# Patient Record
Sex: Male | Born: 1975 | Race: White | Hispanic: No | Marital: Married | Smoking: Never smoker
Health system: Southern US, Community
[De-identification: ages and names within clinical notes are randomized; demographics above are authoritative.]

## PROBLEM LIST (undated history)

## (undated) HISTORY — PX: VASECTOMY: SHX75

---

## 2006-03-28 ENCOUNTER — Ambulatory Visit: Payer: Self-pay | Admitting: Internal Medicine

## 2014-08-05 ENCOUNTER — Ambulatory Visit (INDEPENDENT_AMBULATORY_CARE_PROVIDER_SITE_OTHER): Payer: 59 | Admitting: Family Medicine

## 2014-08-05 ENCOUNTER — Ambulatory Visit (INDEPENDENT_AMBULATORY_CARE_PROVIDER_SITE_OTHER): Payer: 59

## 2014-08-05 VITALS — BP 118/82 | HR 82 | Temp 98.5°F | Resp 16 | Ht 77.0 in | Wt 214.6 lb

## 2014-08-05 DIAGNOSIS — R55 Syncope and collapse: Secondary | ICD-10-CM

## 2014-08-05 DIAGNOSIS — S8391XA Sprain of unspecified site of right knee, initial encounter: Secondary | ICD-10-CM

## 2014-08-05 LAB — COMPREHENSIVE METABOLIC PANEL
ALT: 32 U/L (ref 0–53)
AST: 22 U/L (ref 0–37)
Albumin: 4.5 g/dL (ref 3.5–5.2)
Alkaline Phosphatase: 70 U/L (ref 39–117)
BUN: 13 mg/dL (ref 6–23)
CALCIUM: 9.5 mg/dL (ref 8.4–10.5)
CO2: 26 mEq/L (ref 19–32)
Chloride: 102 mEq/L (ref 96–112)
Creat: 1.08 mg/dL (ref 0.50–1.35)
GLUCOSE: 105 mg/dL — AB (ref 70–99)
POTASSIUM: 4.8 meq/L (ref 3.5–5.3)
Sodium: 138 mEq/L (ref 135–145)
Total Bilirubin: 1.2 mg/dL (ref 0.2–1.2)
Total Protein: 7.3 g/dL (ref 6.0–8.3)

## 2014-08-05 LAB — POCT CBC
GRANULOCYTE PERCENT: 78.2 % (ref 37–80)
HCT, POC: 49.3 % (ref 43.5–53.7)
HEMOGLOBIN: 15.8 g/dL (ref 14.1–18.1)
LYMPH, POC: 1.8 (ref 0.6–3.4)
MCH, POC: 29.1 pg (ref 27–31.2)
MCHC: 32.1 g/dL (ref 31.8–35.4)
MCV: 90.6 fL (ref 80–97)
MID (cbc): 0.3 (ref 0–0.9)
MPV: 7.6 fL (ref 0–99.8)
PLATELET COUNT, POC: 208 10*3/uL (ref 142–424)
POC Granulocyte: 7.5 — AB (ref 2–6.9)
POC LYMPH PERCENT: 18.8 %L (ref 10–50)
POC MID %: 3 % (ref 0–12)
RBC: 5.44 M/uL (ref 4.69–6.13)
RDW, POC: 13.6 %
WBC: 9.6 10*3/uL (ref 4.6–10.2)

## 2014-08-05 LAB — GLUCOSE, POCT (MANUAL RESULT ENTRY): POC GLUCOSE: 108 mg/dL — AB (ref 70–99)

## 2014-08-05 MED ORDER — HYDROCODONE-ACETAMINOPHEN 5-325 MG PO TABS: 1.0000 | ORAL_TABLET | Freq: Three times a day (TID) | ORAL | Status: DC | PRN

## 2014-08-05 NOTE — Patient Instructions (Signed)
Continue to ice and elevate your knee as much as you can. Use ibuprofen as needed for swelling and the vicodin as needed for pain Remember the vicodin will make you sleepy Use the crutches as needed for support, and the knee brace when you are up and about.   We will set you up to see orthopedics next week.  Let me know if you do not hear about this appt soon I think you likely passed out due to a pain related "vagal response."  This will probably not happen again but if you do start to feel faint or nauseated/ sweaty sit down!   If you have any further episodes of passing out please seek care again. I will be in touch with the rest of your labs

## 2014-08-05 NOTE — Progress Notes (Signed)
[facility] [address], [place] [address] [phone]  Date:  08/05/2014   Name:  David Donaldson   DOB:  Apr 27, 1976   MRN:  [id number]  PCP:  No primary care provider on file.    Chief Complaint: Knee Pain   History of Present Illness:  David Donaldson is a 39 y.o. very pleasant male patient who presents with the following:  While playing basketball yesterday he hurt his right knee. His right foot was planted- his son accidnetally hit his lateral knee and forced into into valgus.  He felt and heard a pop, "turned white" from the pain. It is swollen and painful.  He has used some crutches that he had at home. He is able to walk and put some weight on the knee, but he cannot bend it really.    Last night he fell ok,  went to a concert.  After standing for a while he suddently felt very nauseated- he then passed out and was attended to by EMS.  He has not passed out in the past, did not hit his head.  He thinks that the pain caused him to pass out. He does not note any current dizziness, CP or palpitations.    He has some crutches and a hinged knee brace that he is using from home.  These seem to be helping him  He is generally in good health, accompanied today by his wife  There are no active problems to display for this patient.   History reviewed. No pertinent past medical history.  Past Surgical History  Procedure Laterality Date  . Vasectomy      History  Substance Use Topics  . Smoking status: Never Smoker   . Smokeless tobacco: Not on file  . Alcohol Use: 1.8 oz/week    0 Not specified, 3 Glasses of wine per week    Family History  Problem Relation Age of Onset  . Diabetes Father   . Hyperlipidemia Father   . Cancer Sister     No Known Allergies  Medication list has been reviewed and updated.  No current outpatient prescriptions on file prior to visit.   No current facility-administered medications on file prior to visit.     Review of Systems:  As per HPI- otherwise negative.   Physical Examination: Filed Vitals:   08/05/14 1122  BP: 104/64  Pulse: 82  Temp: 98.5 F (36.9 C)  Resp: 16   Filed Vitals:   08/05/14 1122  Height:  (1.956 m)  Weight: 214 lb 9.6 oz (97.342 kg)   Body mass index is 25.44 kg/(m^2). Ideal Body Weight: Weight in (lb) to have BMI = 25: 210.4  GEN: WDWN, NAD, Non-toxic, A & O x 3, fit appearing and healthy HEENT: Atraumatic, Normocephalic. Neck supple. No masses, No LAD. Ears and Nose: No external deformity. CV: RRR, No M/G/R. No JVD. No thrill. No extra heart sounds. PULM: CTA B, no wheezes, crackles, rhonchi. No retractions. No resp. distress. No accessory muscle use. EXTR: No c/c/e NEURO able to bear weight but walking slowly, does not like to fully extend knee. He has his own crutches which are of the appropriate height PSYCH: Normally interactive. Conversant. Not depressed or anxious appearing.  Calm demeanor.  Right knee:  Mild swelling/ effusion.  Tenderness at the inferior/ medial patella.  Ligaments are stable to my exam- no bruise or heat, no instability   [facility] reading (PRIMARY) by  Dr.  Luciano Cinquemani. Right knee: negative except for small effusion CLINICAL DATA: Right knee sprain. Initial encounter. Fall.  EXAM: RIGHT KNEE - COMPLETE 4+ VIEW  COMPARISON: None.  FINDINGS: Moderate suprapatellar joint effusion. No acute fracture or dislocation. Joint spaces maintained.  IMPRESSION: Suprapatellar joint effusion.  EKG:  Rate 62, NSR  Results for orders placed or performed in visit on 08/05/14  POCT CBC  Result Value Ref Range   WBC 9.6 4.6 - 10.2 K/uL   Lymph, poc 1.8 0.6 - 3.4   POC LYMPH PERCENT 18.8 10 - 50 %L   MID (cbc) 0.3 0 - 0.9   POC MID % 3.0 0 - 12 %M   POC Granulocyte 7.5 (A) 2 - 6.9   Granulocyte percent 78.2 37 - 80 %G   RBC 5.44 4.69 - 6.13 M/uL   Hemoglobin 15.8 14.1 - 18.1 g/dL   HCT, POC 29.5 62.1 - 53.7 %   MCV 90.6 80 -  97 fL   MCH, POC 29.1 27 - 31.2 pg   MCHC 32.1 31.8 - 35.4 g/dL   RDW, POC 30.8 %   Platelet Count, POC 208 142 - 424 K/uL   MPV 7.6 0 - 99.8 fL  POCT glucose (manual entry)  Result Value Ref Range   POC Glucose 108 (A) 70 - 99 mg/dl    Assessment and Plan: Right knee sprain, initial encounter - Plan: DG Knee Complete 4 Views Right, Ambulatory referral to Orthopedic Surgery, HYDROcodone-acetaminophen (NORCO/VICODIN) 5-325 MG per tablet  Vasovagal syncope - Plan: POCT CBC, POCT glucose (manual entry), EKG 12-Lead, Comprehensive metabolic panel  Knee sprain. He felt most comfortable in the hinged knee brace that he had with him.  He will continue this and his crutches.  He may have an internal derangement of some kind.  Vicodin as needed for pain and referral to ortho asap  Syncope: likely vagal response to pain.  Will treat pain with vicodin, await the rest of his labs.  He will report if any further syncope  Signed Abbe Amsterdam, MD

## 2014-08-06 ENCOUNTER — Encounter: Payer: Self-pay | Admitting: Family Medicine

## 2014-08-31 ENCOUNTER — Ambulatory Visit: Payer: 59

## 2014-10-27 ENCOUNTER — Ambulatory Visit (INDEPENDENT_AMBULATORY_CARE_PROVIDER_SITE_OTHER): Payer: 59 | Admitting: Psychology

## 2014-10-27 DIAGNOSIS — F4323 Adjustment disorder with mixed anxiety and depressed mood: Secondary | ICD-10-CM | POA: Diagnosis not present

## 2014-11-15 ENCOUNTER — Ambulatory Visit (INDEPENDENT_AMBULATORY_CARE_PROVIDER_SITE_OTHER): Payer: 59 | Admitting: Psychology

## 2014-11-15 DIAGNOSIS — F4323 Adjustment disorder with mixed anxiety and depressed mood: Secondary | ICD-10-CM

## 2014-12-01 ENCOUNTER — Ambulatory Visit (INDEPENDENT_AMBULATORY_CARE_PROVIDER_SITE_OTHER): Payer: 59 | Admitting: Psychology

## 2014-12-01 DIAGNOSIS — F4323 Adjustment disorder with mixed anxiety and depressed mood: Secondary | ICD-10-CM

## 2014-12-22 ENCOUNTER — Ambulatory Visit: Payer: 59 | Admitting: Psychology

## 2015-01-12 ENCOUNTER — Ambulatory Visit (INDEPENDENT_AMBULATORY_CARE_PROVIDER_SITE_OTHER): Payer: 59 | Admitting: Psychology

## 2015-01-12 DIAGNOSIS — F4323 Adjustment disorder with mixed anxiety and depressed mood: Secondary | ICD-10-CM | POA: Diagnosis not present

## 2015-01-26 ENCOUNTER — Ambulatory Visit: Payer: 59 | Admitting: Psychology

## 2015-02-21 ENCOUNTER — Ambulatory Visit (INDEPENDENT_AMBULATORY_CARE_PROVIDER_SITE_OTHER): Payer: 59 | Admitting: Psychology

## 2015-02-21 DIAGNOSIS — F4323 Adjustment disorder with mixed anxiety and depressed mood: Secondary | ICD-10-CM | POA: Diagnosis not present

## 2015-03-07 ENCOUNTER — Ambulatory Visit: Payer: 59 | Admitting: Psychology

## 2015-05-06 IMAGING — CR DG KNEE COMPLETE 4+V*R*
4 series · 4 of 4 positions shown · non-contrast
Comparison: None.

CLINICAL DATA: Right knee sprain.  Initial encounter.  Fall.

EXAM:
RIGHT KNEE - COMPLETE 4+ VIEW

[AP]
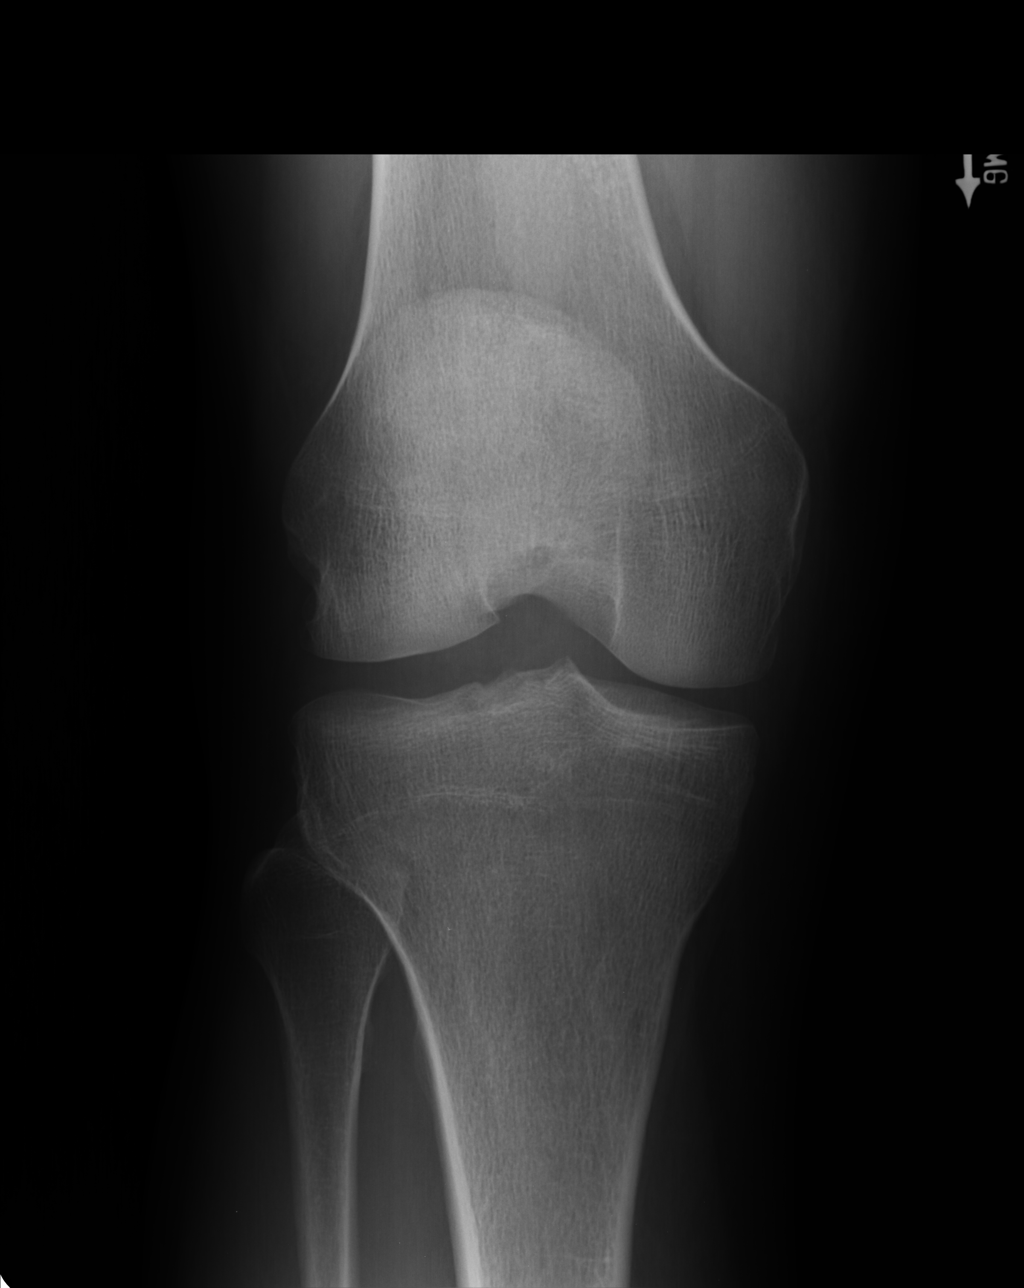

[ap axial]
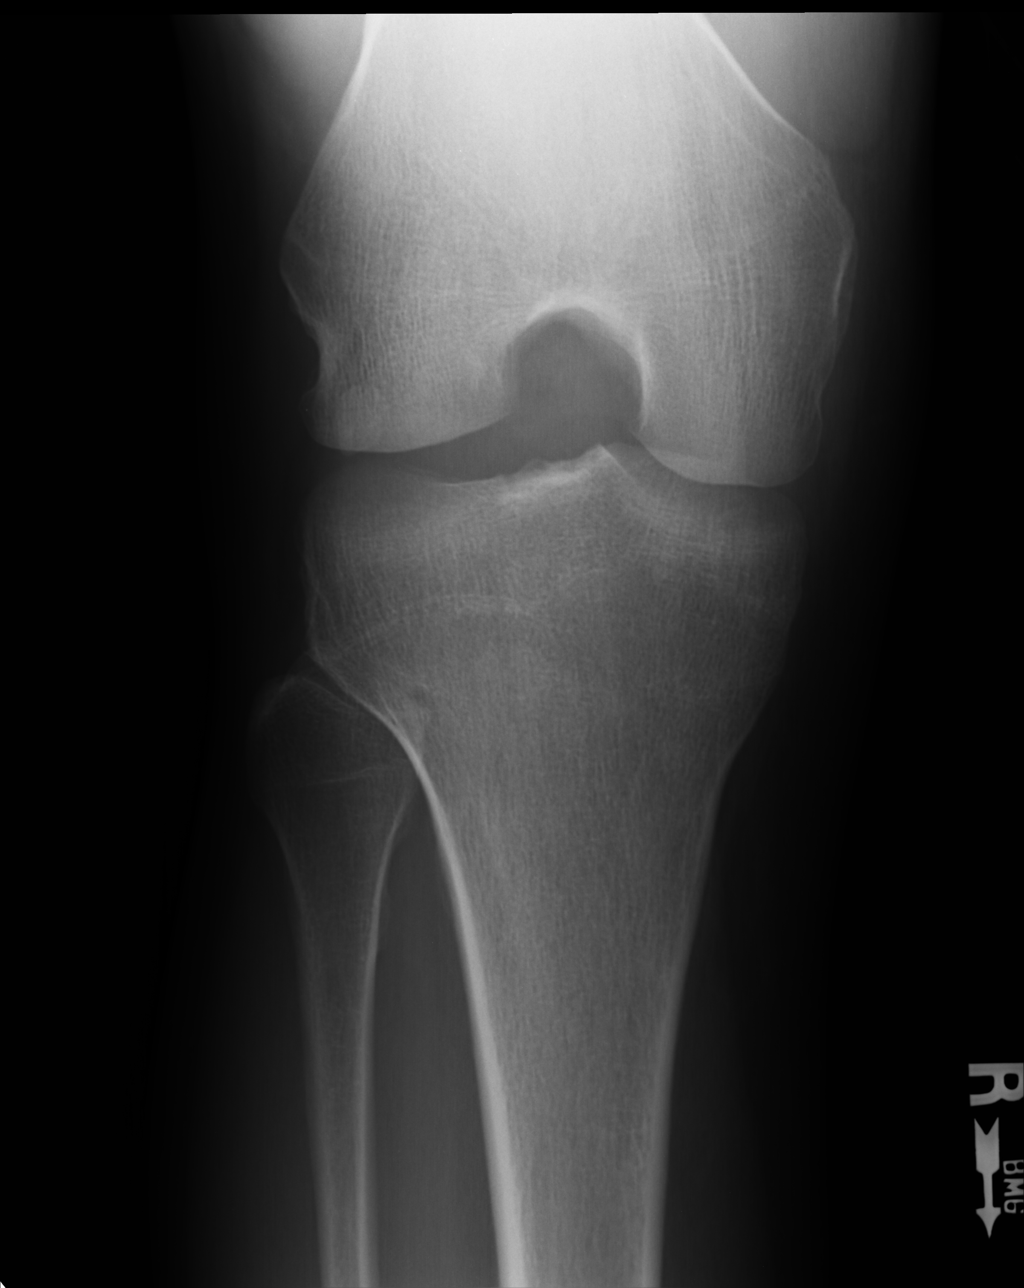

[sunrise]
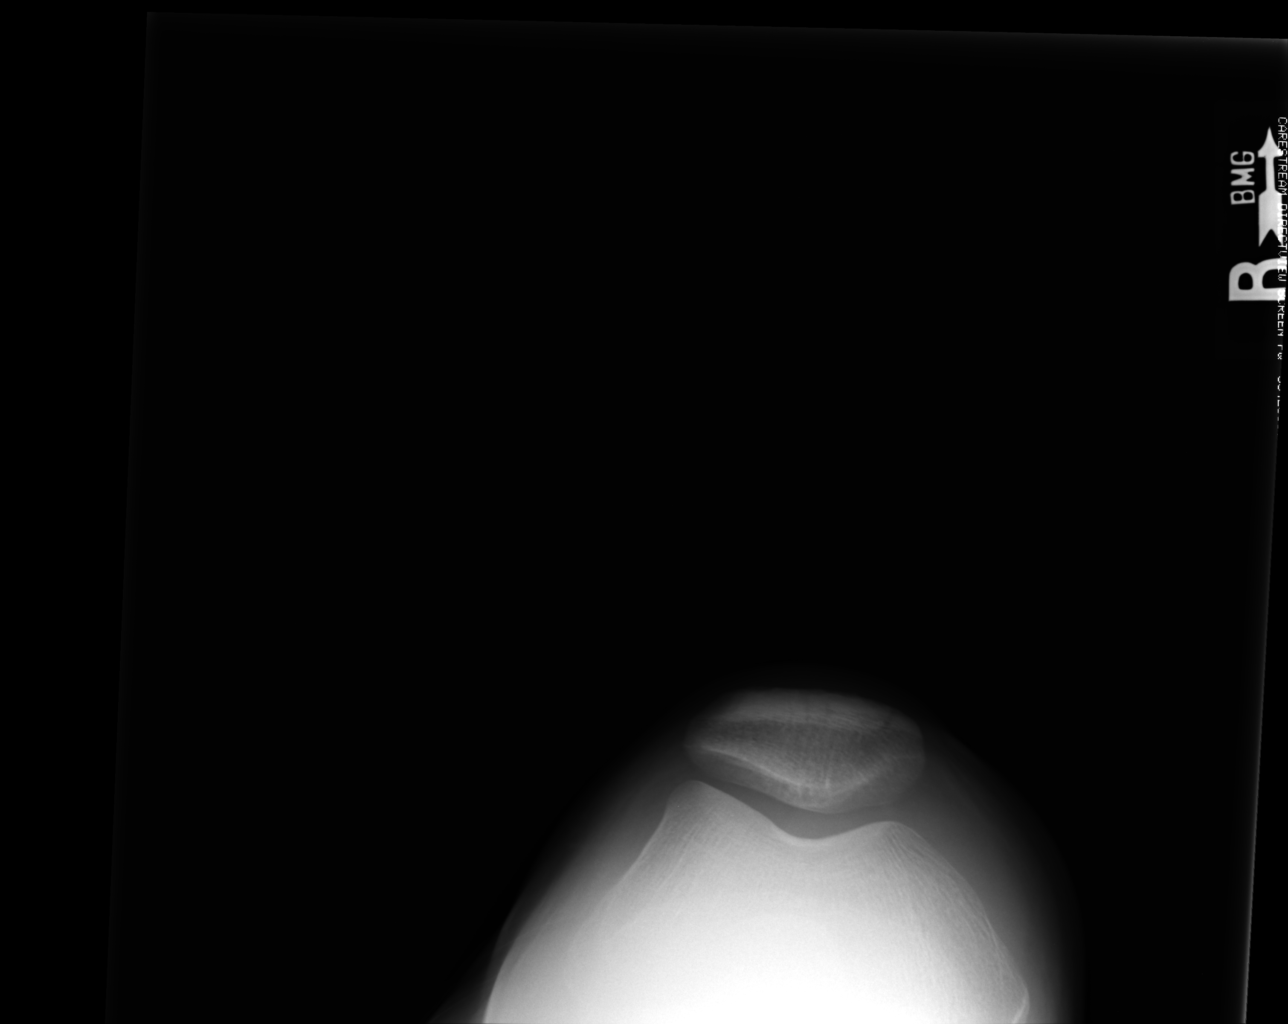

[lateral]
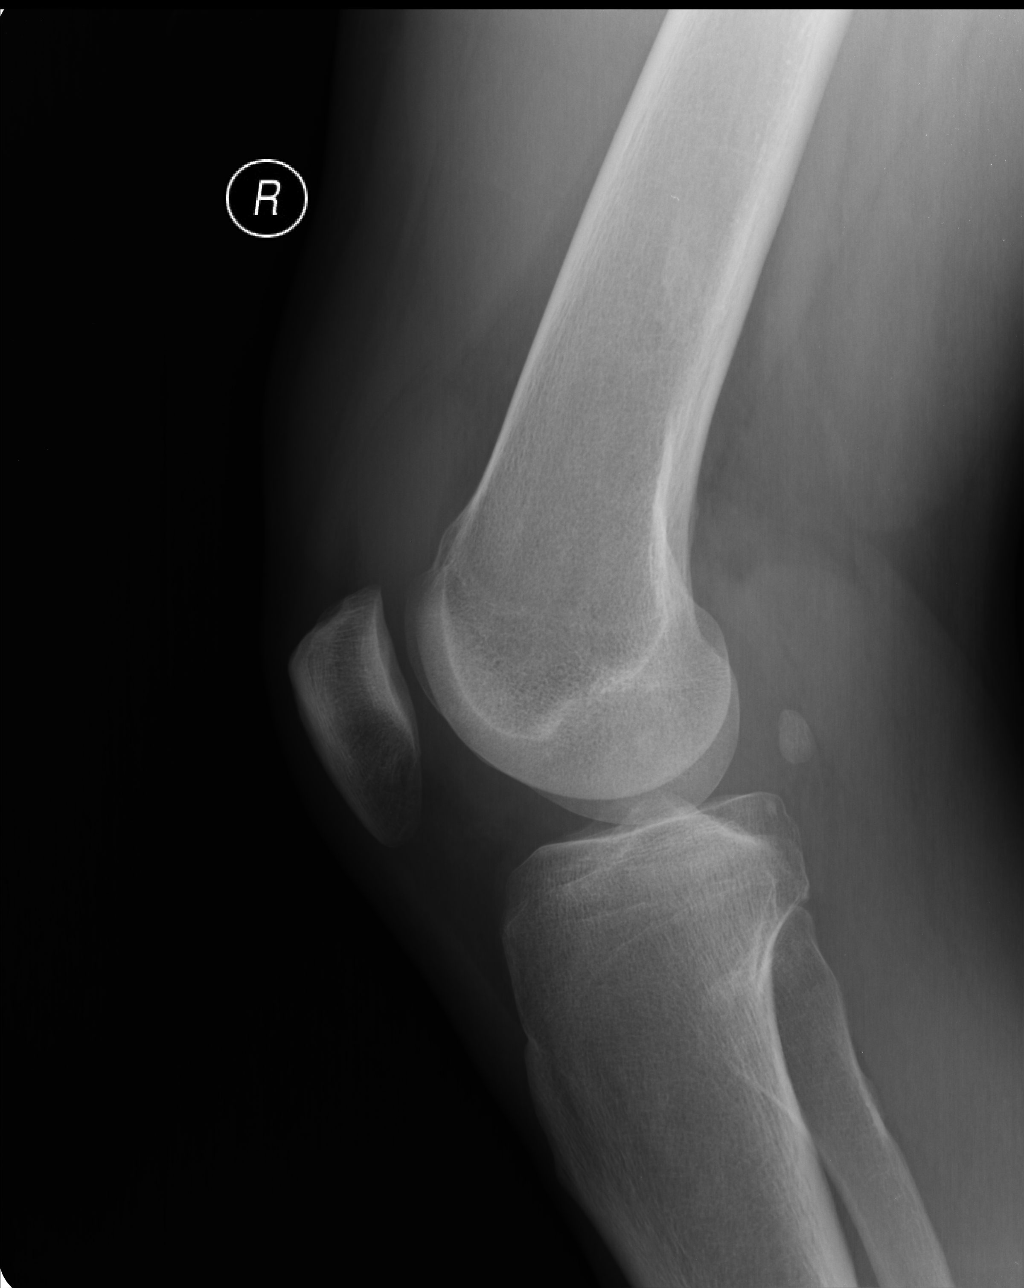

[4 of 4 positions shown; findings below may reference images not displayed]

FINDINGS: Moderate suprapatellar joint effusion. No acute fracture or
dislocation. Joint spaces maintained.
IMPRESSION: Suprapatellar joint effusion.

## 2018-05-12 ENCOUNTER — Other Ambulatory Visit: Payer: Self-pay

## 2018-05-12 ENCOUNTER — Telehealth: Payer: Self-pay | Admitting: Psychiatry

## 2018-05-12 NOTE — Telephone Encounter (Signed)
Pt. Called and said that he is out of town on business  And forget his paxil. Would like [place] to call in at four days woth to the [facility] in [place]. The number is [phone]. Next appointment is November 7th.

## 2018-05-12 NOTE — Telephone Encounter (Signed)
Called into requested pharmacy in [place], given information paxil 10mg /day #4.

## 2018-05-31 ENCOUNTER — Encounter: Payer: Self-pay | Admitting: Emergency Medicine

## 2018-05-31 DIAGNOSIS — F32A Depression, unspecified: Secondary | ICD-10-CM | POA: Insufficient documentation

## 2018-05-31 DIAGNOSIS — F329 Major depressive disorder, single episode, unspecified: Secondary | ICD-10-CM | POA: Insufficient documentation

## 2018-06-11 ENCOUNTER — Ambulatory Visit: Payer: 59 | Admitting: Psychiatry

## 2018-09-08 ENCOUNTER — Ambulatory Visit (INDEPENDENT_AMBULATORY_CARE_PROVIDER_SITE_OTHER): Payer: Managed Care, Other (non HMO) | Admitting: Psychiatry

## 2018-09-08 ENCOUNTER — Ambulatory Visit: Payer: 59 | Admitting: Psychiatry

## 2018-09-08 DIAGNOSIS — F32A Depression, unspecified: Secondary | ICD-10-CM

## 2018-09-08 DIAGNOSIS — F329 Major depressive disorder, single episode, unspecified: Secondary | ICD-10-CM

## 2018-09-08 MED ORDER — VORTIOXETINE HBR 10 MG PO TABS: 10.0000 mg | ORAL_TABLET | Freq: Every day | ORAL | 1 refills | Status: DC

## 2018-09-08 MED ORDER — PAROXETINE HCL 10 MG PO TABS: ORAL_TABLET | ORAL | 0 refills | Status: DC

## 2018-09-08 NOTE — Progress Notes (Signed)
[facility]  Patient ID: David Donaldson,  MRN: [id number]  PCP: Patient, No Pcp Per  Date of Evaluation: 09/08/2018 Time spent:20 minutes  Chief Complaint:   HISTORY/CURRENT STATUS: HPI patient last seen August 2019 he was doing well. He continues to do well.  He is concerned of some weight gain on the Paxil is asking about switching to other medication.  He is currently on Paxil 10 mg a day.  Individual Medical History/ Review of Systems: Changes? :No   Allergies: Patient has no known allergies.  Current Medications: paxil Medication Side Effects: weight gain  Family Medical/ Social History: Changes? No  MENTAL HEALTH EXAM:  There were no vitals taken for this visit.There is no height or weight on file to calculate BMI.  General Appearance: Casual  Eye Contact:  Good  Speech:  Clear and Coherent  Volume:  Normal  Mood:  Euthymic  Affect:  Appropriate  Thought Process:  Linear  Orientation:  Full (Time, Place, and Person)  Thought Content: Logical   Suicidal Thoughts:  No  Homicidal Thoughts:  No  Memory:  WNL  Judgement:  Good  Insight:  Good  Psychomotor Activity:  Normal  Concentration:  Concentration: Good  Recall:  Good  Fund of Knowledge: Good  Language: Good  Assets:  Desire for Improvement  ADL's:  Intact  Cognition: WNL  Prognosis:  Good    DIAGNOSES:    ICD-10-CM   1. Depression, unspecified depression type F32.9     Receiving Psychotherapy: No    RECOMMENDATIONS: Patient will taper the dose of Paxil.  He is currently at 10 mg and he is to continue that daily for 2 weeks.  The same time he will start Trintellix 5 mg a day for a week and then 10 mg a day.  Samples given.   Anne Fu, PA-C

## 2018-09-19 ENCOUNTER — Encounter (HOSPITAL_COMMUNITY): Payer: Self-pay | Admitting: *Deleted

## 2018-09-19 ENCOUNTER — Other Ambulatory Visit: Payer: Self-pay

## 2018-09-19 ENCOUNTER — Ambulatory Visit (INDEPENDENT_AMBULATORY_CARE_PROVIDER_SITE_OTHER): Payer: Managed Care, Other (non HMO)

## 2018-09-19 ENCOUNTER — Ambulatory Visit (HOSPITAL_COMMUNITY)
Admission: EM | Admit: 2018-09-19 | Discharge: 2018-09-19 | Disposition: A | Discharge status: Home / Self Care | Payer: Managed Care, Other (non HMO) | Attending: Family Medicine | Admitting: Family Medicine

## 2018-09-19 DIAGNOSIS — J45990 Exercise induced bronchospasm: Secondary | ICD-10-CM | POA: Diagnosis not present

## 2018-09-19 DIAGNOSIS — R0602 Shortness of breath: Secondary | ICD-10-CM | POA: Diagnosis not present

## 2018-09-19 MED ORDER — ALBUTEROL SULFATE HFA 108 (90 BASE) MCG/ACT IN AERS: INHALATION_SPRAY | RESPIRATORY_TRACT | 0 refills | Status: AC

## 2018-09-19 NOTE — Discharge Instructions (Signed)
Drink plenty of fluids Use the inhaler prior to sleeping and prior to exercise for about a week.  I would then discontinue the inhaler and see how you do.  If your symptoms have improved, inhaler may not be needed long-term.  If you continue to have symptoms I recommend following up with your primary care doctor. If you are worse at any time instead of better, please return here or to the emergency department for care

## 2018-09-19 NOTE — ED Triage Notes (Signed)
C/O ongoing SOB x 2.5 wks upon returning from [place] ([place]) with general malaise and runny nose without fever. 1 wk ago went running while in [place], and felt "could barely breathe".  SOB worsening over past 5 days.  2 nights ago woke during night with SOB. Denies fevers.  Last night used wife's inhaler and reports significant relief.

## 2018-09-19 NOTE — ED Provider Notes (Signed)
[facility]    CSN: [id number] Arrival date & time: 09/19/18  1203     History   Chief Complaint Chief Complaint  Patient presents with  . Appointment    12:00  . Shortness of Breath    HPI CRISOFORO RADWAN is a 43 y.o. male.   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  Took temp and it was "97.something".  States he has taken temp multiple times and it has never been elevated. He developed mild rhinorrhea, enough to take a cold medicine.  Mild cough, not consistent. Feeling of mild SOB at rest . Flew to [place] to spend a few days with his brother. Significant SOB with snowboarding and running. More SOB the last 5 d Now back here the last 2 days he has awakened in the middle of the night with a sweat and feeling he cannot breathe. Used wife inhaler last night at 4 am with some improvement. Currently well with only a little SOB, feels like he cannot take a full breath. Appetite is good.  No NVD.   History reviewed. No pertinent past medical history.  Patient Active Problem List   Diagnosis Date Noted  . Depression 05/31/2018    Past Surgical History:  Procedure Laterality Date  . VASECTOMY         Home Medications    Prior to Admission medications   Medication Sig Start Date End Date Taking? Authorizing Provider  vortioxetine HBr (TRINTELLIX) 10 MG TABS tablet Take 1 tablet (10 mg total) by mouth daily. 09/08/18  Yes Shugart, Mat Carne, PA-C  albuterol (PROVENTIL HFA;VENTOLIN HFA) 108 (90 Base) MCG/ACT inhaler Use as needed before sleep  and exercise 09/19/18   Eustace Moore, MD    Family History Family History  Problem Relation Age of Onset  . Diabetes Father   . Hyperlipidemia Father   . Cancer Sister     Social History Social History   Tobacco Use  . Smoking status: Never Smoker  . Smokeless tobacco: Never Used  Substance Use Topics  . Alcohol use: Yes    Comment: weekly  . Drug use: No     Allergies   Patient has no known allergies.   Review of Systems Review of Systems  Constitutional: Positive for fatigue. Negative for chills and fever.  HENT: Positive for rhinorrhea. Negative for ear pain and sore throat.   Eyes: Negative for pain and visual disturbance.  Respiratory: Positive for shortness of breath. Negative for cough.   Cardiovascular: Negative for chest pain and palpitations.  Gastrointestinal: Negative for abdominal pain and vomiting.  Genitourinary: Negative for dysuria and hematuria.  Musculoskeletal: Negative for arthralgias and back pain.  Skin: Negative for color change and rash.  Neurological: Negative for seizures and syncope.  All other systems reviewed and are negative.    Physical Exam Triage Vital Signs ED Triage Vitals  Enc Vitals Group     BP 09/19/18 1220 118/77     Pulse Rate 09/19/18 1217 (!) 59     Resp  09/19/18 1217 16     Temp 09/19/18 1217 (!) 97.2 F (36.2 C)     Temp Source 09/19/18 1217 Temporal     SpO2 09/19/18 1217 100 %   No data found.  Updated Vital Signs BP 118/77   Pulse (!) 59   Temp (!) 97.2 F (36.2 C) (Temporal)   Resp 16   SpO2 100%       Physical Exam Constitutional:      General: He is not in acute distress.    Appearance: He is well-developed and normal weight. He is not ill-appearing.  HENT:     Head: Normocephalic and atraumatic.     Nose: Nose normal. No congestion or rhinorrhea.     Mouth/Throat:     Mouth: Mucous membranes are moist.     Pharynx: Oropharynx is clear.  Eyes:     Conjunctiva/sclera: Conjunctivae  normal.     Pupils: Pupils are equal, round, and reactive to light.  Neck:     Musculoskeletal: Normal range of motion and neck supple.  Cardiovascular:     Rate and Rhythm: Normal rate and regular rhythm.  Pulmonary:     Effort: Pulmonary effort is normal. No respiratory distress.     Breath sounds: Normal breath sounds. No decreased breath sounds, wheezing, rhonchi or rales.  Chest:     Chest wall: No tenderness.  Abdominal:     General: Bowel sounds are normal. There is no distension.     Palpations: Abdomen is soft. There is no hepatomegaly or splenomegaly.  Musculoskeletal: Normal range of motion.     Right lower leg: No edema.     Left lower leg: No edema.  Lymphadenopathy:     Cervical: No cervical adenopathy.  Skin:    General: Skin is warm and dry.  Neurological:     General: No focal deficit present.     Mental Status: He is alert.  Psychiatric:        Mood and Affect: Mood normal.        Behavior: Behavior normal.      UC Treatments / Results  Labs (all labs ordered are listed, but only abnormal results are displayed) Labs Reviewed - No data to display  EKG None  Radiology Dg Chest 2 View  Result Date: 09/19/2018 CLINICAL DATA:  Per pt: SOB for two weeks, the last two night worse. Patient returned from [place] on 08/27/18, went to [place] and returned here on 2/10. No fever. SOB, wheezing, cough is secondary to the SOB. Non smoker. No history of respiratory or cardiac disease. No HBP, no diabetes EXAM: CHEST - 2 VIEW COMPARISON:  11/18/2010 FINDINGS: The heart size and mediastinal contours are within normal limits. Both lungs are clear. No pleural effusion or pneumothorax. The visualized skeletal structures are unremarkable. IMPRESSION: No active cardiopulmonary disease. Electronically Signed   By: Amie Portland M.D.   On: 09/19/2018 13:52    Procedures Procedures (including critical care time)  Medications Ordered in UC Medications - No data to  display  Initial Impression / Assessment and Plan / UC Course  I have reviewed the triage vital signs and the nursing notes.  Pertinent labs & imaging results that were available during my care of the patient were reviewed by me and considered in my medical decision making (see chart for details).    I called to the department of infection prevention and spoke with the nurse on duty.  She then called Dr. Algis Liming who is  on-call for infectious disease.  After discussion they determined that this patient did not meet criteria for instituting the novel coronavirus protocol. Final Clinical Impressions(s) / UC Diagnoses   Final diagnoses:  Exercise-induced reactive airway disease     Discharge Instructions     Drink plenty of fluids Use the inhaler prior to sleeping and prior to exercise for about a week.  I would then discontinue the inhaler and see how you do.  If your symptoms have improved, inhaler may not be needed long-term.  If you continue to have symptoms I recommend following up with your primary care doctor. If you are worse at any time instead of better, please return here or to the emergency department for care   Greater than 50% of this visit was spent in counseling and coordinating care.  Total face to face time:  45.  Lot of time reviewing Coronavirus  ED Prescriptions    Medication Sig Dispense Auth. Provider   albuterol (PROVENTIL HFA;VENTOLIN HFA) 108 (90 Base) MCG/ACT inhaler Use as needed before sleep and exercise 1 Inhaler Eustace MooreNelson, Rosaleah Person Sue, MD     Controlled Substance Prescriptions [place] Controlled Substance Registry consulted? Not Applicable   Eustace MooreNelson, Jonalyn Sedlak Sue, MD 09/19/18 [phone]

## 2018-10-08 ENCOUNTER — Ambulatory Visit (INDEPENDENT_AMBULATORY_CARE_PROVIDER_SITE_OTHER): Payer: 59 | Admitting: Psychiatry

## 2018-10-08 DIAGNOSIS — F329 Major depressive disorder, single episode, unspecified: Secondary | ICD-10-CM

## 2018-10-08 DIAGNOSIS — F32A Depression, unspecified: Secondary | ICD-10-CM

## 2018-10-08 MED ORDER — VORTIOXETINE HBR 10 MG PO TABS: 10.0000 mg | ORAL_TABLET | Freq: Every day | ORAL | 1 refills | Status: DC

## 2018-10-08 NOTE — Progress Notes (Signed)
[facility]  Patient ID: David Donaldson,  MRN: [id number]  PCP: Patient, No Pcp Per  Date of Evaluation: 10/08/2018 Time spent:20 minutes  Chief Complaint:   HISTORY/CURRENT STATUS: HPI patient last seen 09/08/2018 and was doing well.  He was supposed to increase his Trintellix up to 10 mg a day but he kept it 5 cassettes what he understood. He continues to do well.  Individual Medical History/ Review of Systems: Changes? :No   Allergies: Patient has no known allergies.  Current Medications:  Current Outpatient Medications:  .  vortioxetine HBr (TRINTELLIX) 10 MG TABS tablet, Take 1 tablet (10 mg total) by mouth daily., Disp: 30 tablet, Rfl: 1 .  albuterol (PROVENTIL HFA;VENTOLIN HFA) 108 (90 Base) MCG/ACT inhaler, Use as needed before sleep and exercise, Disp: 1 Inhaler, Rfl: 0 Medication Side Effects: none  Family Medical/ Social History: Changes? No  MENTAL HEALTH EXAM:  There were no vitals taken for this visit.There is no height or weight on file to calculate BMI.  General Appearance: Casual  Eye Contact:  Good  Speech:  Clear and Coherent  Volume:  Normal  Mood:  Euthymic  Affect:  Appropriate  Thought Process:  Linear  Orientation:  Full (Time, Place, and Person)  Thought Content: Logical   Suicidal Thoughts:  No  Homicidal Thoughts:  No  Memory:  WNL  Judgement:  Good  Insight:  Good  Psychomotor Activity:  Normal  Concentration:  Concentration: Good  Recall:  Good  Fund of Knowledge: Good  Language: Good  Assets:  Desire for Improvement  ADL's:  Intact  Cognition: WNL  Prognosis:  Good    DIAGNOSES:    ICD-10-CM   1. Depression, unspecified depression type F32.9     Receiving Psychotherapy: No    RECOMMENDATIONS: Patient continues to do well.  I am having him go up his Trintellix 10 mg a day but he does feel there is room for improvement. Return in 2 months.   Anne Fu, PA-C

## 2018-12-10 ENCOUNTER — Ambulatory Visit: Payer: 59 | Admitting: Psychiatry

## 2019-02-04 ENCOUNTER — Encounter (INDEPENDENT_AMBULATORY_CARE_PROVIDER_SITE_OTHER): Payer: Self-pay

## 2019-02-04 ENCOUNTER — Other Ambulatory Visit: Payer: Self-pay

## 2019-02-04 ENCOUNTER — Ambulatory Visit (INDEPENDENT_AMBULATORY_CARE_PROVIDER_SITE_OTHER): Payer: 59 | Admitting: Psychiatry

## 2019-02-04 ENCOUNTER — Encounter: Payer: Self-pay | Admitting: Psychiatry

## 2019-02-04 DIAGNOSIS — F341 Dysthymic disorder: Secondary | ICD-10-CM | POA: Diagnosis not present

## 2019-02-04 MED ORDER — VORTIOXETINE HBR 10 MG PO TABS: 10.0000 mg | ORAL_TABLET | Freq: Every day | ORAL | 5 refills | Status: DC

## 2019-02-04 NOTE — Progress Notes (Signed)
[facility]  Patient ID: David Donaldson,  MRN: [id number]  PCP: Patient, No Pcp Per  Date of Evaluation: 02/04/2019 Time spent:15 minutes from 0920 to 0940  Chief Complaint:  Chief Complaint    Depression; Anxiety      HISTORY/CURRENT STATUS: David Donaldson is seen onsite in office face-to-face individually with consent with Epic and old chart of David Donaldson previous provider now deceased as collateral for psychiatric interview and exam in 79-month evaluation and management of depression with anxiety.  Patient is familiar to me from 10/27/2015 consultation for David Donaldson to address specific diagnosis of depression for patient to be confident and comfortable in continued treatment, particularly tapering and discontinuing Latuda at that time for side effects including weight gain.  In the interim, Paxil was changed to Trintellix when off previous Latuda and Wellbutrin with good response taken most days at 0900.  He is concerned that being up to 6 hours late on his dose, he experiences some discontinuation grouchiness and irritability.  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  He and wife are coping and communicating well.  As in his last consultation by myself, each facet of diagnosis and treatment are reviewed in detail to establish cognitive confidence and effective security in his current treatment, particularly with the decease of his provider.  He has no mania, psychosis, suicidality, or delirium.  His previous therapist David Donaldson from therapy start up January 2016 has retired, and he started with David Donaldson at that time as well.  He reports the need to resolve endemic as he needs a social life again missing people. Depression      The patient  presents with depression.  This is a chronic problem.  The current episode started more than 1 year ago.   The onset quality is gradual.   The problem occurs every several days.  The problem has been gradually improving since onset.  Associated symptoms include irritable, restlessness and sad.  Associated symptoms include no decreased concentration, no fatigue, no hopelessness, does not have insomnia, no decreased interest, no headaches and no suicidal ideas.     The symptoms are aggravated by work stress and social issues.  Past treatments include other medications, psychotherapy and SSRIs - Selective serotonin reuptake inhibitors.  Compliance with treatment is good.  Previous treatment provided significant relief.  Risk factors include family history of mental illness, family history, stress, major life event and a change in medication usage/dosage.   Past medical history includes depression and mental health disorder.     Pertinent negatives include no life-threatening condition, no physical disability, no recent psychiatric admission, no anxiety, no bipolar disorder, no eating disorder, no obsessive-compulsive disorder, no post-traumatic stress disorder, no schizophrenia, no suicide attempts and no head trauma.   Individual Medical History/ Review of Systems: Changes? :Yes Has albuterol inhaler for sensation of tight breathing after risk of COVID exposure in [place]  Allergies: Patient has no known allergies.  Current Medications:  Current Outpatient Medications:  .  albuterol (PROVENTIL HFA;VENTOLIN HFA) 108 (90 Base) MCG/ACT inhaler, Use as needed before sleep and exercise, Disp: 1 Inhaler, Rfl: 0 .  vortioxetine HBr (TRINTELLIX) 10 MG TABS tablet, Take 1 tablet (10 mg total) by mouth daily., Disp: 30 tablet,  Rfl: 5   Medication Side Effects: Discontinuation irritability if 6 hours late for dose  Family Medical/ Social History: Changes? 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  Others deferred as non-essential in coronavirus pandemic  General Appearance: Casual, Meticulous and Well Groomed  Eye Contact:  Good  Speech:  Clear and Coherent, Normal Rate and Talkative  Volume:  Normal  Mood:  Dysphoric and Euthymic  Affect:  Full Range and Anxious with inappropriate mood at times  Thought Process:  Goal Directed and Irrelevant  Orientation:  Full (Time, Place, and Person)  Thought Content: Obsessions and Rumination   Suicidal Thoughts:  No  Homicidal Thoughts:  No  Memory:  Immediate;   Good Remote;   Good  Judgement:  Good  Insight:  Fair  Psychomotor Activity:  Normal, Increased, Mannerisms and Restlessness  Concentration:  Concentration: Fair and Attention Span: Good  Recall:  Good  Fund of Knowledge: Good  Language: Good  Assets:  Desire for Improvement Leisure Time Social Support Vocational/Educational  ADL's:  Intact  Cognition: WNL  Prognosis:  Good    DIAGNOSES:    ICD-10-CM   1. 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  Patient is updated on prevention and monitoring for treatment symptom matching with safety hygiene and crisis plans if needed.  Grief and loss for previous provider are addressed conjointly in closure as options for ongoing and future care are reviewed.  He is E scribed Trintellix 30 mg every morning #30 with [facility] with  options should he again experience recurrent symptoms being augmentation with lithium or buspirone.  He agrees to follow-up in 6 months or sooner if needed.   Chauncey MannGlenn E Denielle Bayard, MD

## 2019-05-31 ENCOUNTER — Other Ambulatory Visit: Payer: Self-pay

## 2019-05-31 DIAGNOSIS — Z20822 Contact with and (suspected) exposure to covid-19: Secondary | ICD-10-CM

## 2019-06-01 LAB — NOVEL CORONAVIRUS, NAA: SARS-CoV-2, NAA: NOT DETECTED

## 2019-06-20 IMAGING — DX DG CHEST 2V
2 series · 3 of 3 positions shown · non-contrast
Comparison: 11/18/2010

CLINICAL DATA: Per pt: SOB for two weeks, the last two night worse.
Patient returned from [place] on 08/27/18, went to [HOSPITAL] and
returned here on [DATE]. No fever. SOB, wheezing, cough is secondary
to the SOB. Non smoker. No history of respiratory or cardiac
disease. No HBP, no diabetes

EXAM:
CHEST - 2 VIEW

[Series 2: chest pa · 0.14mm/px · 2 of 2 slices shown]
[im 1/2]
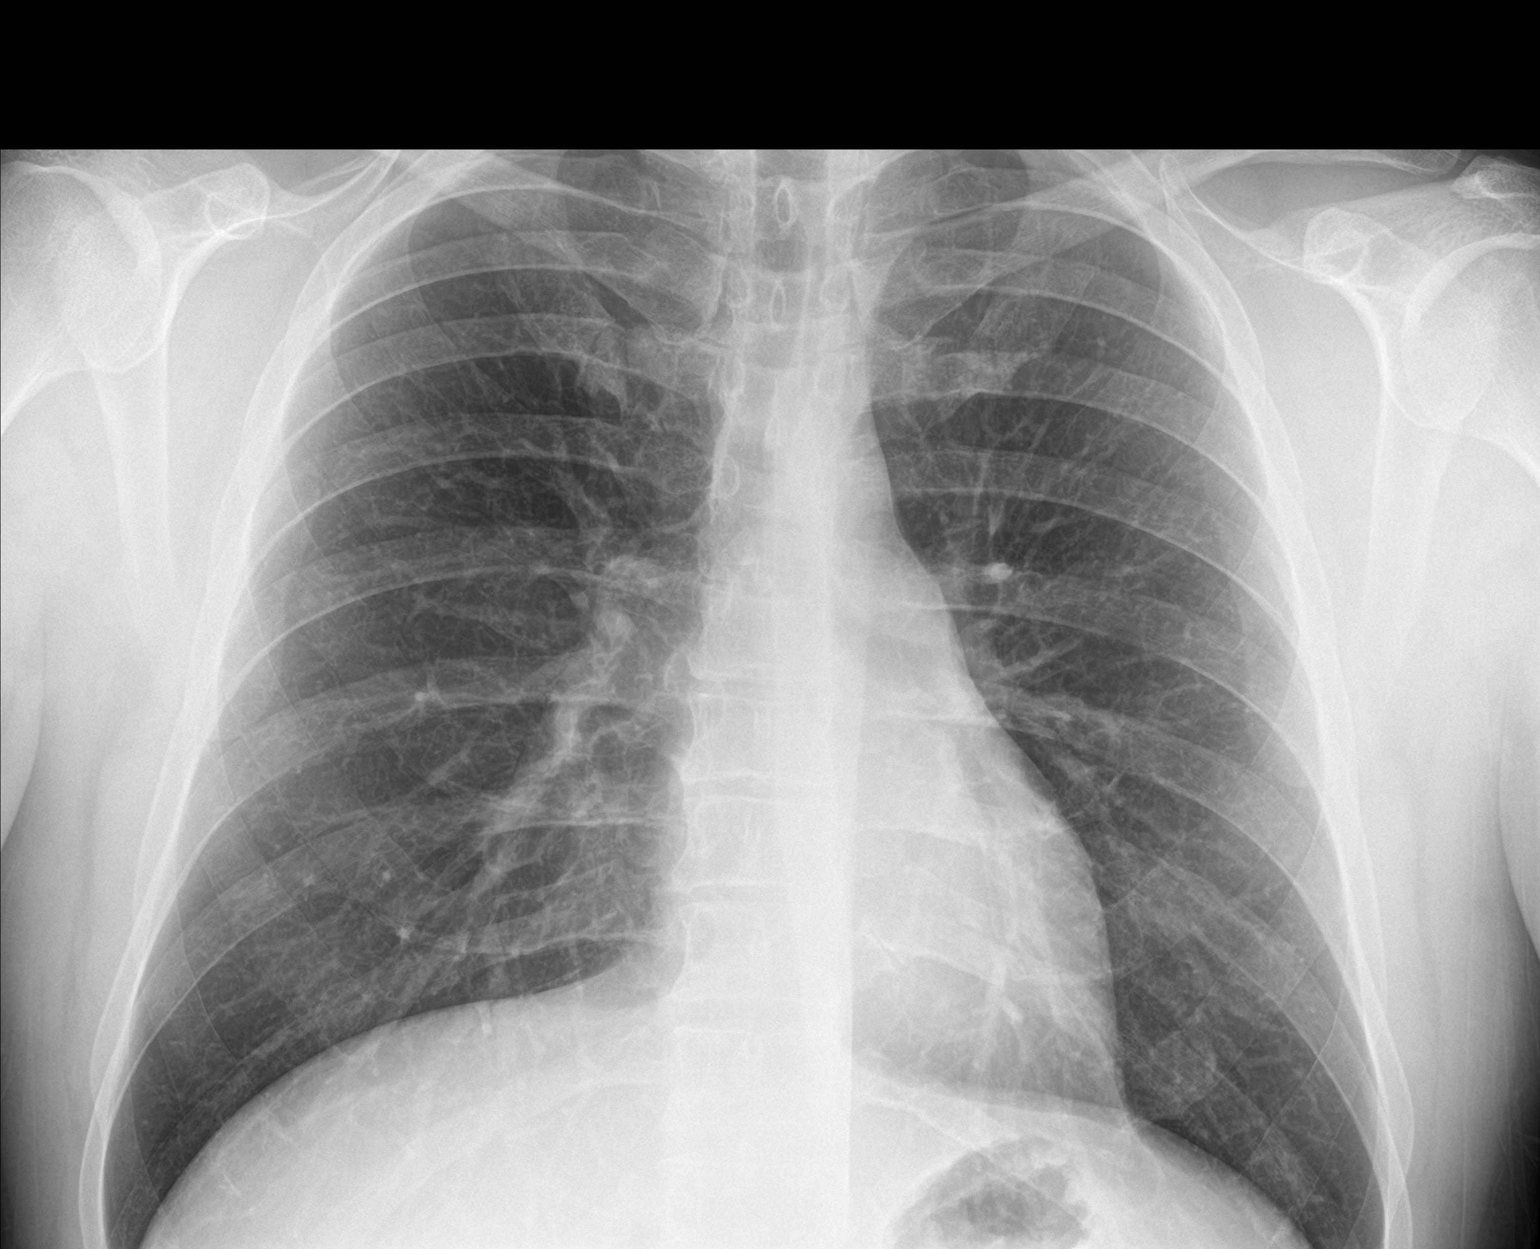
[im 2/2]
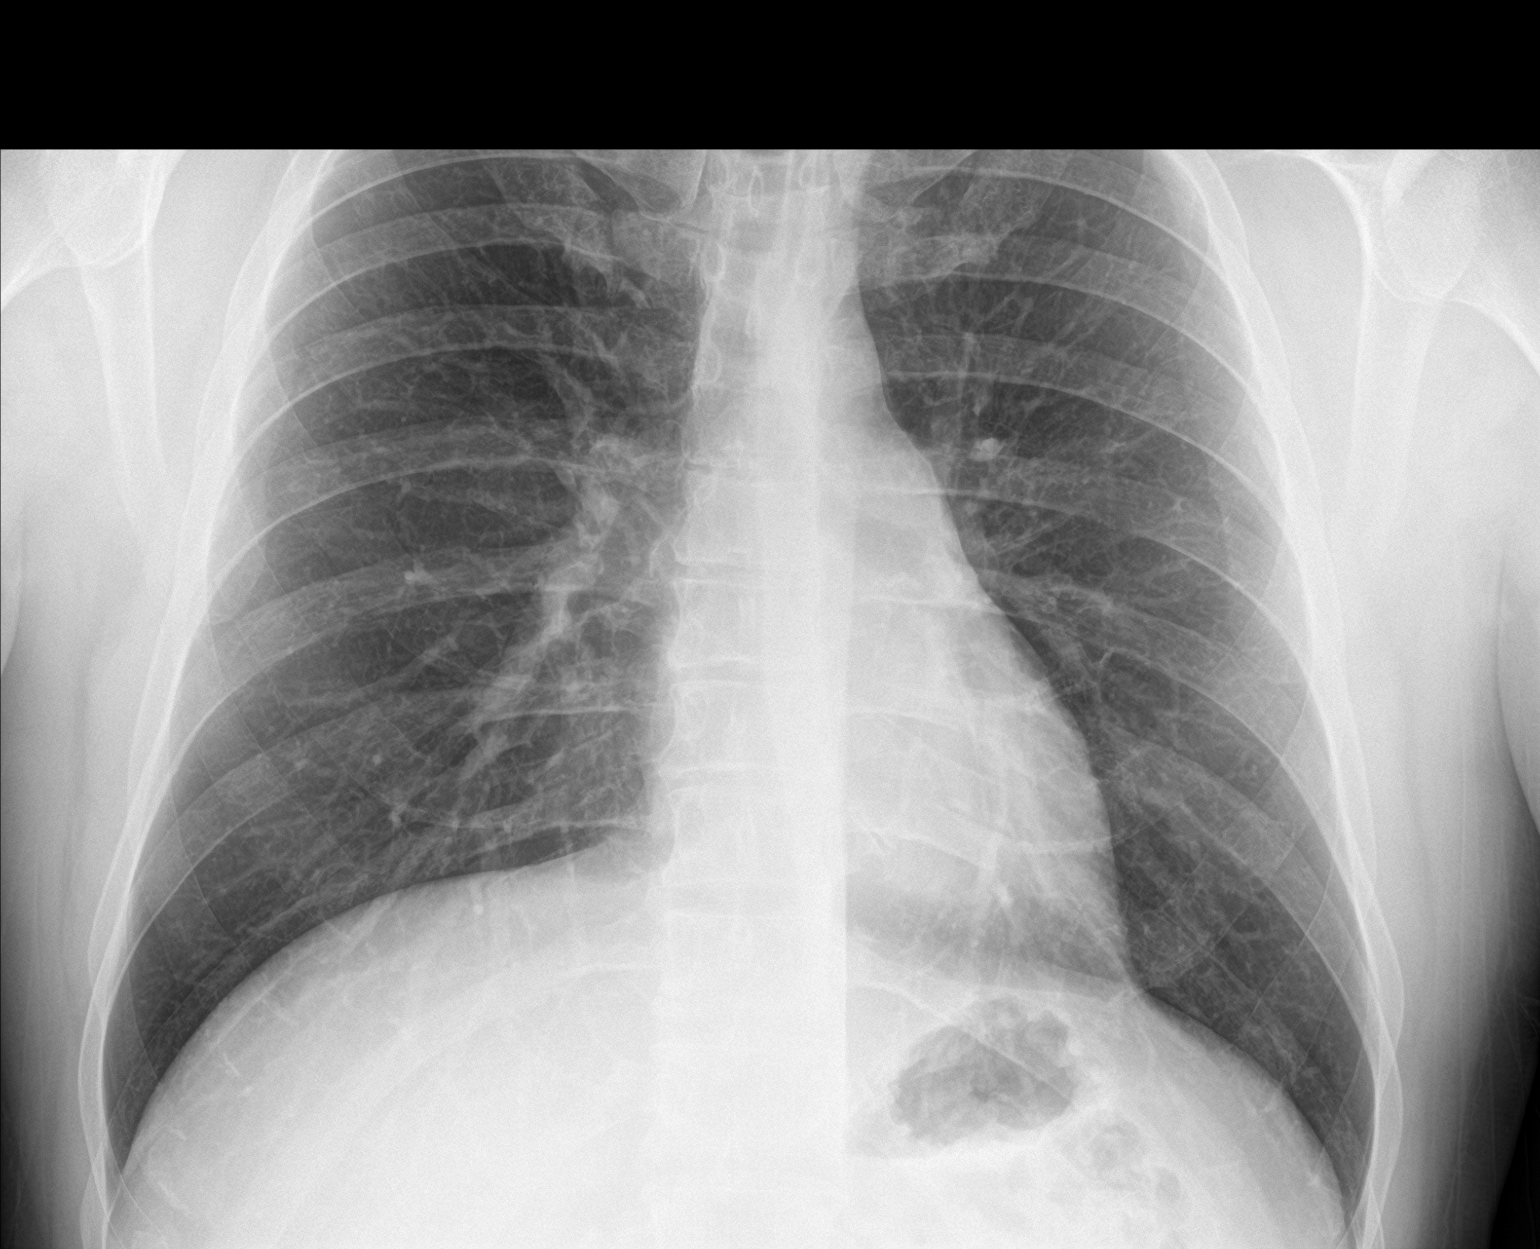

[chest lat]
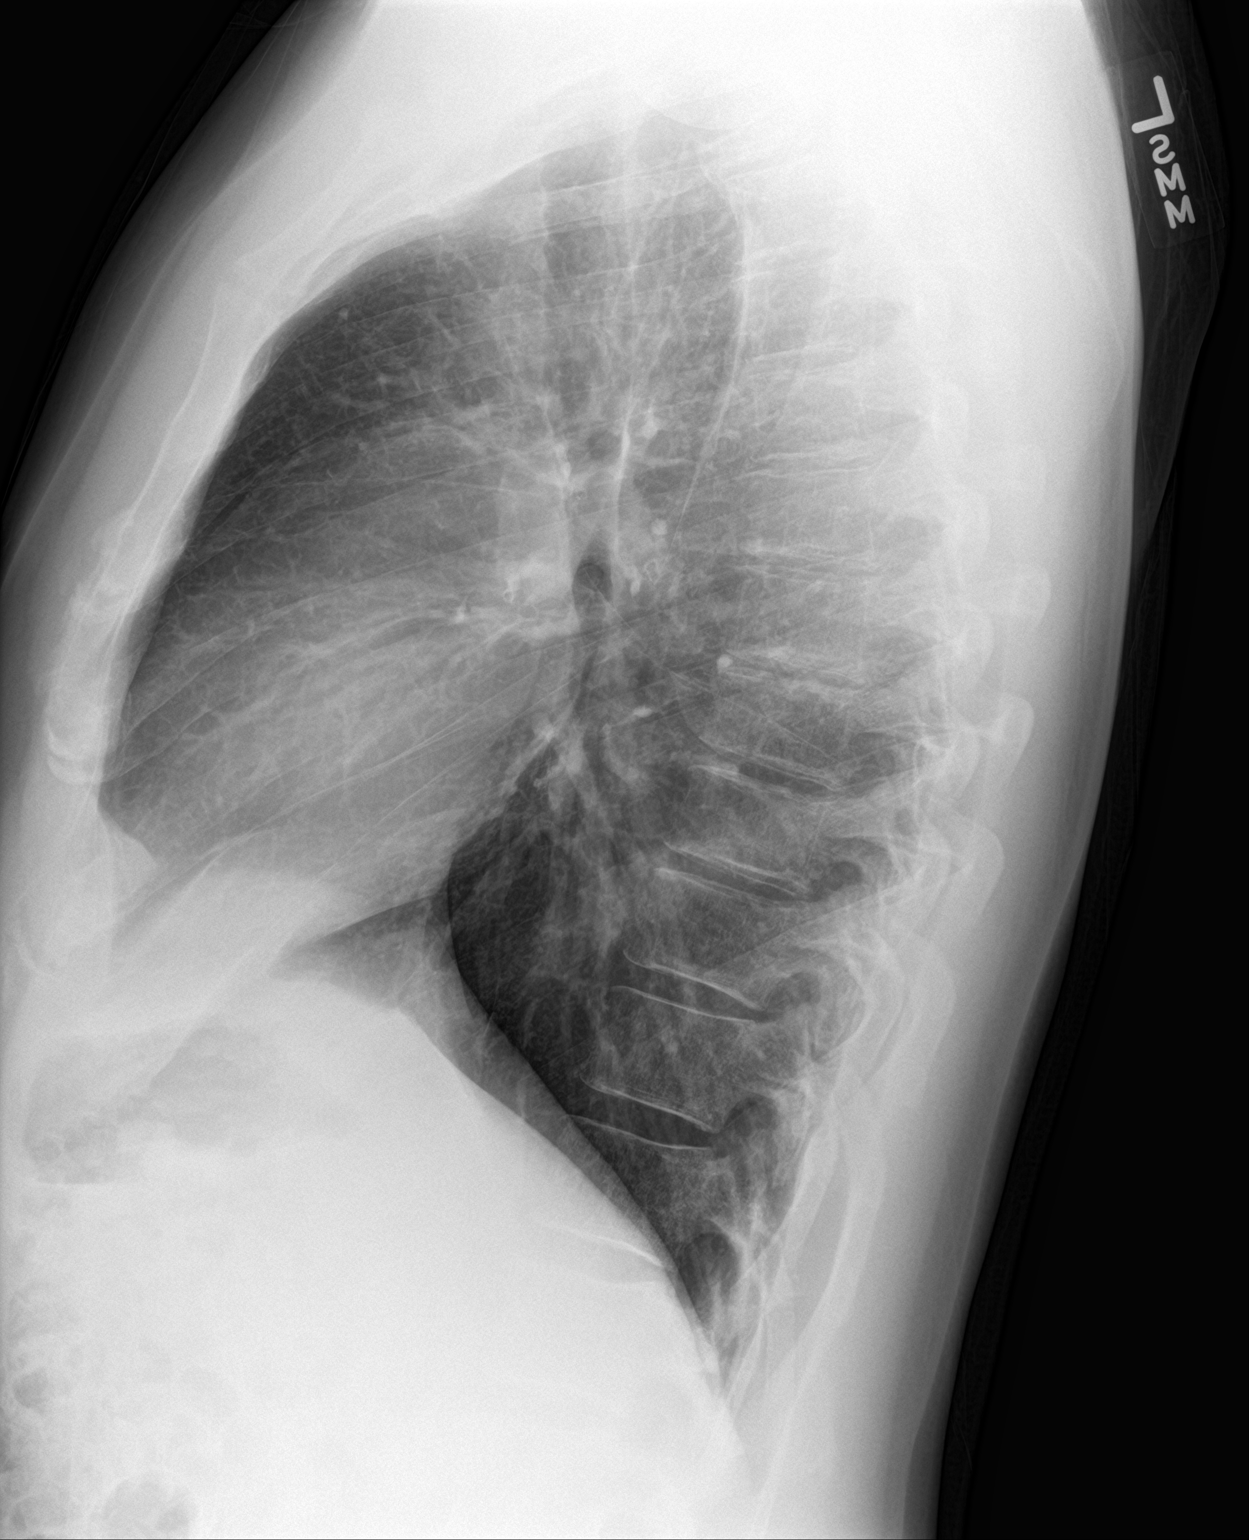

[3 of 3 positions shown; findings below may reference images not displayed]

FINDINGS: The heart size and mediastinal contours are within normal limits.
Both lungs are clear. No pleural effusion or pneumothorax. The
visualized skeletal structures are unremarkable.
IMPRESSION: No active cardiopulmonary disease.

## 2019-06-22 ENCOUNTER — Other Ambulatory Visit: Payer: Self-pay

## 2019-06-22 DIAGNOSIS — Z20822 Contact with and (suspected) exposure to covid-19: Secondary | ICD-10-CM

## 2019-06-24 LAB — NOVEL CORONAVIRUS, NAA: SARS-CoV-2, NAA: NOT DETECTED

## 2019-08-10 ENCOUNTER — Encounter: Payer: Self-pay | Admitting: Psychiatry

## 2019-08-10 ENCOUNTER — Other Ambulatory Visit: Payer: Self-pay

## 2019-08-10 ENCOUNTER — Ambulatory Visit (INDEPENDENT_AMBULATORY_CARE_PROVIDER_SITE_OTHER): Payer: 59 | Admitting: Psychiatry

## 2019-08-10 VITALS — Ht 76.0 in | Wt 231.0 lb

## 2019-08-10 DIAGNOSIS — F341 Dysthymic disorder: Secondary | ICD-10-CM | POA: Diagnosis not present

## 2019-08-10 DIAGNOSIS — T43205A Adverse effect of unspecified antidepressants, initial encounter: Secondary | ICD-10-CM | POA: Insufficient documentation

## 2019-08-10 MED ORDER — VORTIOXETINE HBR 20 MG PO TABS: 10.0000 mg | ORAL_TABLET | Freq: Two times a day (BID) | ORAL | 1 refills | Status: DC

## 2019-08-10 NOTE — Progress Notes (Signed)
[facility]  Patient ID: DEWARD SEBEK,  MRN: [id number]  PCP: Patient, No Pcp Per  Date of Evaluation: 08/10/2019 Time spent:20 minutes from 53 to 0940  Chief Complaint:  Chief Complaint    Depression      HISTORY/CURRENT STATUS: David Donaldson is seen onsite in office 20 minutes face-to-face individually with consent with epic collateral for psychiatric interview and exam in 48-month evaluation and management of dysthymic disorder having even more pronounced and frequent discontinuation symptoms.  Last appointment reviewed the clinical course of symptoms for treatment matching relative to weight gain on Paxil and Latuda doing best now on Trintellix except that at 24 to 28 hours after a dose, he has flulike dissonance and dizziness relieved by taking his dose but disrupting his time.  He asks for ways to compensate for such as his medicine is otherwise well tolerated and effectively regulating his depression well.  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  Wife may visit him but Covid remains confining of him to his office in the basement of his house otherwise.  Whereas the discontinuation symptoms were modest last appointment, he finds them an obstacle currently particularly as he considers traveling overseas.  We therefore address options today for attention, monitoring, management, and optimal outcome.  He has no mania, suicidality, psychosis or delirium.  Depression      The patient presents with depression as a chronic problem.  The current episode started more than 5 years ago.   The onset quality is gradual.   The problem occurs every several days.  The problem has been gradually improving since onset of treatment.  Associated symptoms include irritable, easy dissatisfaction, restlessness, flulike discontinuation symptoms, and sad.   Associated symptoms include no decreased concentration, no fatigue, no hopelessness, does not have insomnia, no decreased interest, no headaches and no suicidal ideas.     The symptoms are aggravated by work stress and social issues.  Past treatments include other medications, psychotherapy and SSRIs - Selective serotonin reuptake inhibitors.  Compliance with treatment is good.  Previous treatment provided significant relief.  Risk factors include family history of mental illness, family history, stress, major life event and a change in medication usage/dosage.   Past medical history includes depression and mental health disorder.  Pertinent negatives include no life-threatening condition, no physical disability, no recent psychiatric admission, no anxiety, no bipolar disorder, no eating disorder, no obsessive-compulsive disorder, no post-traumatic stress disorder, no schizophrenia, no suicide attempts and no head trauma.  Individual Medical History/ Review of Systems: Changes? :Yes Up by 9 pounds from 222 pounds last appointment.  Allergies: Patient has no known allergies.  Current Medications:  Current Outpatient Medications:  .  albuterol (PROVENTIL HFA;VENTOLIN HFA) 108 (90 Base) MCG/ACT inhaler, Use as needed before sleep and exercise, Disp: 1 Inhaler, Rfl: 0 .  vortioxetine HBr (TRINTELLIX) 20 MG TABS tablet, Take 0.5 tablets (10 mg total) by mouth 2 (two) times daily., Disp: 90 tablet, Rfl: 1   Medication Side Effects: none septum discontinuation flulike sensation when he is late on dose  Family Medical/ Social History: Changes?  Yes being assigned likely to have extended stay on the job in [place] of at least 3 months  [place]:  Height 6\' 4"  (1.93 m), weight 231 lb (104.8 kg).Body mass index  is 28.12 kg/m. Muscle strengths and tone 5/5, postural reflexes and gait 0/0, and AIMS = 0 otherwise deferred for coronavirus shutdown  General Appearance: Casual, Fairly Groomed and  Meticulous  Eye Contact:  Good  Speech:  Clear and Coherent, Normal Rate and Talkative  Volume:  Normal  Mood:  Dysphoric and Euthymic  Affect:  Inappropriate and Full Range  Thought Process:  Coherent, Goal Directed, Irrelevant and Descriptions of Associations: Circumstantial  Orientation:  Full (Time, Place, and Person)  Thought Content: Obsessions and Rumination   Suicidal Thoughts:  No  Homicidal Thoughts:  No  Memory:  Immediate;   Good Remote;   Good  Judgement:  Good  Insight:  Fair  Psychomotor Activity:  Normal and Mannerisms  Concentration:  Concentration: Fair and Attention Span: Good  Recall:  Good  Fund of Knowledge: Good  Language: Good  Assets:  Desire for Improvement Resilience Social Support Vocational/Educational  ADL's:  Intact  Cognition: WNL  Prognosis:  Good    DIAGNOSES:    ICD-10-CM   1. 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 Antidepressant discontinuation syndrome, initial encounter  T43.205A     Receiving Psychotherapy: No    RECOMMENDATIONS: Psychosupportive psychoeducation for prevention and monitoring and safety hygiene updates cognitive behavioral object relations and frustration management for 10 minutes of counseling and coordination of care as 50% of the 20-minute face-to-face session time.  Symptom treatment matching for medication concludes to increase current Trintellix dividing the dose to 20 mg tablet taking 1/2 tablet twice daily sent as a 90-day supply and 1 refill to [facility] with clarification of need for stented vacation type supply for his overseas employment treating his dysthymic disorder managing the antidepressant discontinuation symptoms.  Otherwise he returns for follow-up in 6 months or sooner if needed.   Chauncey Mann, MD

## 2020-02-08 ENCOUNTER — Ambulatory Visit: Payer: 59 | Admitting: Psychiatry

## 2020-05-25 ENCOUNTER — Encounter: Payer: Self-pay | Admitting: Psychiatry

## 2020-07-10 ENCOUNTER — Ambulatory Visit: Payer: 59 | Admitting: Psychiatry

## 2020-07-11 ENCOUNTER — Other Ambulatory Visit: Payer: Self-pay

## 2020-07-11 ENCOUNTER — Ambulatory Visit (INDEPENDENT_AMBULATORY_CARE_PROVIDER_SITE_OTHER): Payer: 59 | Admitting: Psychiatry

## 2020-07-11 ENCOUNTER — Telehealth: Payer: Self-pay | Admitting: Psychiatry

## 2020-07-11 ENCOUNTER — Encounter: Payer: Self-pay | Admitting: Psychiatry

## 2020-07-11 VITALS — Ht 76.0 in | Wt 226.0 lb

## 2020-07-11 DIAGNOSIS — F341 Dysthymic disorder: Secondary | ICD-10-CM | POA: Diagnosis not present

## 2020-07-11 MED ORDER — VORTIOXETINE HBR 20 MG PO TABS: 10.0000 mg | ORAL_TABLET | Freq: Two times a day (BID) | ORAL | 1 refills | Status: DC

## 2020-07-11 NOTE — Telephone Encounter (Signed)
Pt called and said that [facility] doesn't fill 90 days of the trintellix so he would [facility] [address] and [facility] the one at [facility]

## 2020-07-11 NOTE — Progress Notes (Signed)
[facility]  Patient ID: [name],  MRN: [id number]  PCP: Patient, No Pcp Per  Date of Evaluation: 07/11/2020 Time spent:15 minutes from 1325 to 1340  Chief Complaint:  Chief Complaint    Depression      HISTORY/CURRENT STATUS: David Donaldson is seen onsite in office 15 minutes face-to-face individually with consent with epic collateral for psychiatric interview and exam in 57-month evaluation and management dysthymic disorder with atypical features now significantly in remission.  The patient had 3 years of treatment with Anne Fu, PA-C in this office treated variably with Paxil, Zoloft, and Wellbutrin prior to change over to Trintellix in February 2020.  He also had augmentation with Seroquel and then Latuda finding weight gain among other side effects not attenuated by Wellbutrin and therefore all discontinued.  As of first appointment with me, the patient had medication wear off irritability and disruptiveness better with increased dose of Trintellix to 10 mg then noting some discontinuation symptoms at the last appointment if more than 2 to 4 hours late for the next dose so that twice daily dosing has been considered in addition to using the higher dose.  He and finds that executive function is excellent on the Trintellix now as he works from [place] coming home twice yearly visiting for a month as he has to quarantine for 3 weeks each time he enters [place].  He therefore cannot cover his usual territory of [place] but only virtually.  He is up-to-date on the care of wife and son and improving as son is Dietitian for college.  He has no mania, suicidality, psychosis or delirium.  He recalls gaining some weight after meniscus surgery on his knee but is otherwise medically intact.  He has seen me only twice but now must transfer to advanced practitioner again similar to [facility], PA-C in the office due to my imminent retirement.  The dosing  of Trintellix therefore as one half of a 20 mg tablet twice daily has been his optimal dose, though he often just takes the morning dose when in his routine in [place] in which he considers his general environment reasonably small and manageable.   Individual Medical History/ Review of Systems: Changes? :Yes  Weight is down 5 pounds in the last 11 months, patient insisting that he did come in for an appointment last visit to his family 6 months ago though epic only records 11 months ago  Allergies: Patient has no known allergies.  Current Medications:  Current Outpatient Medications:  .  albuterol (PROVENTIL HFA;VENTOLIN HFA) 108 (90 Base) MCG/ACT inhaler, Use as needed before sleep and exercise, Disp: 1 Inhaler, Rfl: 0 .  vortioxetine HBr (TRINTELLIX) 20 MG TABS tablet, Take 0.5 tablets (10 mg total) by mouth 2 (two) times daily., Disp: 90 tablet, Rfl: 1  Medication Side Effects: none except some wear off irritability at low dose and some discontinuation symptoms at 26 hours after moderate single daily dosing.  Family Medical/ Social History: Changes? No noting previously father may have had anxiety and depression comorbid with his diabetes mellitus.  MENTAL HEALTH EXAM:  Height 6\' 4"  (1.93 m), weight 226 lb (102.5 kg).Body mass index is 27.51 kg/m. Muscle strengths and tone 5/5, postural reflexes and gait 0/0, and AIMS = 0.  General Appearance: Casual, Meticulous and Well Groomed  Eye Contact:  Good  Speech:  Clear and Coherent, Normal Rate and Talkative  Volume:  Normal  Mood:  Euthymic  Affect:  Congruent, Inappropriate and Full Range  Thought Process:  Coherent, Goal Directed and Descriptions of Associations: Circumstantial  Orientation:  Full (Time, Place, and Person)  Thought Content: Rumination   Suicidal Thoughts:  No  Homicidal Thoughts:  No  Memory:  Immediate;   Good Remote;   Good  Judgement:  Good  Insight:  Good  Psychomotor Activity:  Normal and Mannerisms   Concentration:  Concentration: Good and Attention Span: Good  Recall:  Fair  Fund of Knowledge: Good  Language: Good  Assets:  Desire for Improvement Physical Health Resilience Social Support Talents/Skills Vocational/Educational  ADL's:  Intact  Cognition: WNL  Prognosis:  Good    DIAGNOSES:    ICD-10-CM   1. 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  F34.1 vortioxetine HBr (TRINTELLIX) 20 MG TABS tablet    DISCONTINUED: vortioxetine HBr (TRINTELLIX) 20 MG TABS tablet              Canceled that sent to [facility] [address] [facility]   Receiving Psychotherapy: No though previously had therapy with Eyvonne Mechanic, PhD in this office before the retirement of that psychologist   RECOMMENDATIONS: Psychosupportive psychoeducation integrates cognitive behavioral nutrition, social problem-solving, and frustration management with symptom treatment matching for medication as elements of discontinuation and wear off can be understood from  both pharmacodynamic as well as environment affective system interface perspectives.  At this time the patient plans follow-up in 6 to 12 months with Corie Chiquito, PMHNP in transition transfer from my case closure for retirement.  He is E scribed Trintellix 20 mg taking 1/2 tablet twice daily sent as #90 with 1 refill canceled at [facility] and [facility] [address] due to supply and insurance requirements, though often taking only the half tablet in the morning when in [place] for atypical dysthymia.   Chauncey Mann, MD

## 2021-02-21 ENCOUNTER — Other Ambulatory Visit: Payer: Self-pay | Admitting: Psychiatry

## 2021-02-21 DIAGNOSIS — F341 Dysthymic disorder: Secondary | ICD-10-CM

## 2021-10-13 ENCOUNTER — Other Ambulatory Visit: Payer: Self-pay | Admitting: Psychiatry

## 2021-10-13 DIAGNOSIS — F341 Dysthymic disorder: Secondary | ICD-10-CM

## 2022-05-03 ENCOUNTER — Other Ambulatory Visit: Payer: Self-pay | Admitting: Behavioral Health

## 2022-05-03 DIAGNOSIS — F341 Dysthymic disorder: Secondary | ICD-10-CM

## 2022-05-03 MED ORDER — VORTIOXETINE HBR 20 MG PO TABS: ORAL_TABLET | ORAL | 0 refills | Status: DC

## 2022-07-23 ENCOUNTER — Encounter: Payer: Self-pay | Admitting: Behavioral Health

## 2022-07-23 ENCOUNTER — Ambulatory Visit (INDEPENDENT_AMBULATORY_CARE_PROVIDER_SITE_OTHER): Payer: 59 | Admitting: Behavioral Health

## 2022-07-23 DIAGNOSIS — F411 Generalized anxiety disorder: Secondary | ICD-10-CM | POA: Diagnosis not present

## 2022-07-23 DIAGNOSIS — F331 Major depressive disorder, recurrent, moderate: Secondary | ICD-10-CM | POA: Diagnosis not present

## 2022-07-23 MED ORDER — VORTIOXETINE HBR 20 MG PO TABS: 20.0000 mg | ORAL_TABLET | Freq: Every day | ORAL | 1 refills | Status: DC

## 2022-07-23 NOTE — Progress Notes (Signed)
[facility]  Patient ID: David Donaldson,  MRN: [id number]  PCP: Patient, No Pcp Per  Date of Evaluation: 07/23/2022 Time spent:30 minutes  Chief Complaint:  Chief Complaint   Depression; Follow-up; Medication Refill; Patient Education     HISTORY/CURRENT STATUS: HPI "David Donaldson", 46 year old male  is seen onsite in office for 30 minutes face-to-face individually with consent with epic collateral for psychiatric interview and exam in 41-month evaluation and management dysthymic disorder with atypical features now significantly in remission.  He and finds that executive function is excellent on the Trintellix now as he works from [place] coming home twice yearly. Reports his anxiety at 2/10 and depression at 1/0. He is sleeping 7-8 hours per night. Requesting adjustments to his medication. He has no mania, suicidality, psychosis or delirium.  No prior psychiatric medication trials   Individual Medical History/ Review of Systems: Changes? :No   Allergies: Patient has no known allergies.  Current Medications:  Current Outpatient Medications:    vortioxetine HBr (TRINTELLIX) 20 MG TABS tablet, Take 1 tablet (20 mg total) by mouth daily., Disp: 90 tablet, Rfl: 1   albuterol (PROVENTIL HFA;VENTOLIN HFA) 108 (90 Base) MCG/ACT inhaler, Use as needed before sleep and exercise, Disp: 1 Inhaler, Rfl: 0   vortioxetine HBr (TRINTELLIX) 20 MG TABS tablet, TAKE 0.5 TABLETS BY MOUTH 2 TIMES DAILY., Disp: 90 tablet, Rfl: 0 Medication Side Effects: none  Family Medical/ Social History: Changes? No  MENTAL HEALTH EXAM:  There were no vitals taken for this visit.There is no height or weight on file to calculate BMI.  General Appearance: Casual and Neat  Eye Contact:  Good  Speech:  Clear and Coherent  Volume:  Normal  Mood:  NA  Affect:  Appropriate  Thought Process:  Coherent  Orientation:  Full (Time, Place, and Person)  Thought Content: Logical   Suicidal Thoughts:  No   Homicidal Thoughts:  No  Memory:  WNL  Judgement:  Good  Insight:  Good  Psychomotor Activity:  Normal  Concentration:  Concentration: Good  Recall:  Good  Fund of Knowledge: Good  Language: Good  Assets:  Desire for Improvement  ADL's:  Intact  Cognition: WNL  Prognosis:  Good    DIAGNOSES:    ICD-10-CM   1. Generalized anxiety disorder  F41.1 vortioxetine HBr (TRINTELLIX) 20 MG TABS tablet    2. Major depressive disorder, recurrent episode, moderate (HCC)  F33.1 vortioxetine HBr (TRINTELLIX) 20 MG TABS tablet      Receiving Psychotherapy: No    RECOMMENDATIONS:  Greater than 50% of 30 min face to face time with patient was spent on counseling and coordination of care. We discussed his previous care under Dr. Shelba Flake retired. We agreed to continue his refills for Trintellix.  He is still living in [place] most of the year and does not have access to medication unless he fills enough supply here. He is currently very stable and would like to continue with his current regimen.     He is E scribed Trintellix 20 mg taking 1/2 tablet twice daily sent as #90 with 1 [facility]ill  [facility] [address] due to supply and insurance requirements, though often taking only the half tablet in the morning when in [place] for atypical dysthymia.  Will report worsening symptoms promptly To follow up in 6-12 months or sooner if possible Provided emergency contact information Reviewed PDMP      Joan Flores, NP

## 2023-06-18 ENCOUNTER — Encounter: Payer: Self-pay | Admitting: Psychiatry

## 2023-07-22 ENCOUNTER — Ambulatory Visit (INDEPENDENT_AMBULATORY_CARE_PROVIDER_SITE_OTHER): Payer: Self-pay | Admitting: Behavioral Health

## 2023-07-22 DIAGNOSIS — Z0389 Encounter for observation for other suspected diseases and conditions ruled out: Secondary | ICD-10-CM

## 2023-07-22 NOTE — Progress Notes (Signed)
Pt did not show for scheduled visit and did not notify this office 24 hour prior as required. Additional fees to be assessed.

## 2023-07-25 ENCOUNTER — Encounter: Payer: Self-pay | Admitting: Behavioral Health

## 2023-07-25 ENCOUNTER — Ambulatory Visit: Payer: 59 | Admitting: Behavioral Health

## 2023-07-25 DIAGNOSIS — F3342 Major depressive disorder, recurrent, in full remission: Secondary | ICD-10-CM

## 2023-07-25 DIAGNOSIS — F411 Generalized anxiety disorder: Secondary | ICD-10-CM | POA: Diagnosis not present

## 2023-07-25 NOTE — Progress Notes (Signed)
[facility]  Patient ID: David Donaldson,  MRN: [id number]  PCP: Patient, No Pcp Per  Date of Evaluation: 07/25/2023 Time spent:30 minutes  Chief Complaint:  Chief Complaint   Anxiety; Depression; Follow-up; Patient Education; Medication Refill     HISTORY/CURRENT STATUS: HPI "David Donaldson", 47 year old male  is seen onsite in office for 30 minutes face-to-face for follow up and medication management. He is currently in full remission with anxiety and depression which is well controlled with daily use of Trintellix.    He and finds that executive function is excellent on the Trintellix now as he works from [place] coming home twice yearly.  Reports his anxiety at 2/10 and depression at 1/0. He is sleeping 7-8 hours per night. He is requesting trial of Trintellix 10 mg and would like to try to reduce his dose. He has no mania, suicidality, psychosis or delirium.   No prior psychiatric medication trials Individual Medical History/ Review of Systems: Changes? :No   Allergies: Patient has no known allergies.  Current Medications:  Current Outpatient Medications:    albuterol (PROVENTIL HFA;VENTOLIN HFA) 108 (90 Base) MCG/ACT inhaler, Use as needed before sleep and exercise, Disp: 1 Inhaler, Rfl: 0   vortioxetine HBr (TRINTELLIX) 20 MG TABS tablet, TAKE 0.5 TABLETS BY MOUTH 2 TIMES DAILY., Disp: 90 tablet, Rfl: 0   vortioxetine HBr (TRINTELLIX) 20 MG TABS tablet, Take 1 tablet (20 mg total) by mouth daily., Disp: 90 tablet, Rfl: 1 Medication Side Effects: none  Family Medical/ Social History: Changes? No  MENTAL HEALTH EXAM:  There were no vitals taken for this visit.There is no height or weight on file to calculate BMI.  General Appearance: Casual  Eye Contact:  Good  Speech:  Clear and Coherent  Volume:  Normal  Mood:  NA  Affect:  Appropriate  Thought Process:  Coherent  Orientation:  Full (Time, Place, and Person)  Thought Content: Logical   Suicidal Thoughts:  No   Homicidal Thoughts:  No  Memory:  WNL  Judgement:  Good  Insight:  Good  Psychomotor Activity:  Normal  Concentration:  Concentration: Good  Recall:  Good  Fund of Knowledge: Good  Language: Good  Assets:  Desire for Improvement  ADL's:  Intact  Cognition: WNL  Prognosis:  Good    DIAGNOSES: No diagnosis found.  Receiving Psychotherapy: No    RECOMMENDATIONS:  Greater than 50% of 30 min face to face time with patient was spent on counseling and coordination of care. Patient continues to have great stability and in full remission with daily use of Trintellix. Due to some mild sexual side effects, he would like to now try reducing his dose down to 5 mg daily.  He will try 5 mg tablet for 10 days to see if he experiences no negative effects. If ok, he will call for #90 Trintellix 10 mg tablets to be prescribed. Still living in [place] and does not have ready access to medication.    Will report worsening symptoms promptly To follow up in 6-12 months or sooner if possible Provided emergency contact information Reviewed PDMP        Joan Flores, NP

## 2023-08-08 ENCOUNTER — Telehealth: Payer: Self-pay | Admitting: Behavioral Health

## 2023-08-08 DIAGNOSIS — F331 Major depressive disorder, recurrent, moderate: Secondary | ICD-10-CM

## 2023-08-08 DIAGNOSIS — F411 Generalized anxiety disorder: Secondary | ICD-10-CM

## 2023-08-08 MED ORDER — VORTIOXETINE HBR 10 MG PO TABS: 10.0000 mg | ORAL_TABLET | Freq: Every day | ORAL | 0 refills | Status: DC

## 2023-08-08 NOTE — Telephone Encounter (Signed)
 Pt called and said that the trintellix 10 mg is working great from the samples. He would like a script of 10 mg of trintillex to the pharmacy is [facility]  on [address]. He is leaving on Monday to go out of town

## 2023-08-08 NOTE — Telephone Encounter (Signed)
 LVM to [facility]. Sent Rx to the requested pharmacy, but it will likely require a PA. Does he need samples?

## 2023-08-08 NOTE — Telephone Encounter (Signed)
 Called pharmacy to see if a PA is needed. It went thru with a copay of $292.00. Provided text info for copay card.

## 2023-10-27 ENCOUNTER — Encounter: Payer: Self-pay | Admitting: Behavioral Health

## 2023-10-27 ENCOUNTER — Telehealth: Payer: 59 | Admitting: Behavioral Health

## 2023-10-27 DIAGNOSIS — F411 Generalized anxiety disorder: Secondary | ICD-10-CM

## 2023-10-27 DIAGNOSIS — F331 Major depressive disorder, recurrent, moderate: Secondary | ICD-10-CM | POA: Diagnosis not present

## 2023-10-27 MED ORDER — VORTIOXETINE HBR 20 MG PO TABS: 20.0000 mg | ORAL_TABLET | Freq: Every day | ORAL | 1 refills | Status: AC

## 2023-10-27 NOTE — Progress Notes (Signed)
 David Donaldson [id number] Oct 06, 1975 48 y.o.  Virtual Visit via Video Note  I connected with pt @ on 10/27/23 at  8:30 AM EDT by a video enabled telemedicine application and verified that I am speaking with the correct person using two identifiers.   I discussed the limitations of evaluation and management by telemedicine and the availability of in person appointments. The patient expressed understanding and agreed to proceed.  I discussed the assessment and treatment plan with the patient. The patient was provided an opportunity to ask questions and all were answered. The patient agreed with the plan and demonstrated an understanding of the instructions.   The patient was advised to call back or seek an in-person evaluation if the symptoms worsen or if the condition fails to improve as anticipated.  I provided 30 minutes of non-face-to-face time during this encounter.  The patient was located at home.  The provider was located at [facility].   Joan Flores, NP   Subjective:   Patient ID:  David Donaldson is a 48 y.o. (DOB [birth year]) male.  Chief Complaint: No chief complaint on file.   HPI "David Donaldson", 48 year old male  is seen onsite in office for 30 minutes face-to-face for follow up and medication management. Says that he had hopes on reducing his dose of Trintellix but was unsuccesful. Says that he started to feel depression "creep back in, and felt darkness again". Says that he self increased his dose to original.  He will be returning to [place] soon and only returns to the [place] twice a year. He is unable to get his medication there.  Reports his anxiety at 3/10 and depression at 3/0. He is sleeping 7-8 hours per night.  He has no mania, suicidality, psychosis or delirium.   No prior psychiatric medication trials   Review of Systems:  Review of Systems  Constitutional: Negative.   Neurological: Negative.   Psychiatric/Behavioral:  Positive for dysphoric mood.      Medications: I have reviewed the patient's current medications.  Current Outpatient Medications  Medication Sig Dispense Refill   vortioxetine HBr (TRINTELLIX) 20 MG TABS tablet Take 1 tablet (20 mg total) by mouth daily. 90 tablet 1   albuterol (PROVENTIL HFA;VENTOLIN HFA) 108 (90 Base) MCG/ACT inhaler Use as needed before sleep and exercise 1 Inhaler 0   No current facility-administered medications for this visit.    Medication Side Effects: None  Allergies: No Known Allergies  No past medical history on file.  Family History  Problem Relation Age of Onset   Diabetes Father    Hyperlipidemia Father    Cancer Sister     Social History   Socioeconomic History   Marital status: Married    Spouse name: Not on file   Number of children: Not on file   Years of education: Not on file   Highest education level: Not on file  Occupational History   Not on file  Tobacco Use   Smoking status: Never   Smokeless tobacco: Never  Vaping Use   Vaping status: Never Used  Substance and Sexual Activity   Alcohol use: Yes    Comment: weekly   Drug use: No   Sexual activity: Not on file  Other Topics Concern   Not on file  Social History Narrative   Not on file   Social Drivers of Health   Financial Resource Strain: Not on file  Food Insecurity: Not on file  Transportation Needs: Not on  file  Physical Activity: Not on file  Stress: Not on file  Social Connections: Not on file  Intimate Partner Violence: Not on file    Past Medical History, Surgical history, Social history, and Family history were reviewed and updated as appropriate.   Please see review of systems for further details on the patient's review from today.   Objective:   Physical Exam:  There were no vitals taken for this visit.  Physical Exam Constitutional:      General: He is not in acute distress.    Appearance: Normal appearance.  Abdominal:     Palpations: There is mass.  Neurological:      Mental Status: He is alert and oriented to person, place, and time.     Gait: Gait normal.  Psychiatric:        Attention and Perception: Attention and perception normal. He does not perceive auditory or visual hallucinations.        Mood and Affect: Mood and affect normal. Mood is not anxious or depressed. Affect is not labile.        Speech: Speech normal.        Behavior: Behavior normal. Behavior is cooperative.        Thought Content: Thought content normal.        Cognition and Memory: Cognition and memory normal.        Judgment: Judgment normal.     Lab Review:     Component Value Date/Time   NA 138 08/05/2014 1218   K 4.8 08/05/2014 1218   CL 102 08/05/2014 1218   CO2 26 08/05/2014 1218   GLUCOSE 105 (H) 08/05/2014 1218   BUN 13 08/05/2014 1218   CREATININE 1.08 08/05/2014 1218   CALCIUM 9.5 08/05/2014 1218   PROT 7.3 08/05/2014 1218   ALBUMIN 4.5 08/05/2014 1218   AST 22 08/05/2014 1218   ALT 32 08/05/2014 1218   ALKPHOS 70 08/05/2014 1218   BILITOT 1.2 08/05/2014 1218       Component Value Date/Time   WBC 9.6 08/05/2014 1219   RBC 5.44 08/05/2014 1219   HGB 15.8 08/05/2014 1219   HCT 49.3 08/05/2014 1219   MCV 90.6 08/05/2014 1219   MCH 29.1 08/05/2014 1219   MCHC 32.1 08/05/2014 1219    No results found for: "POCLITH", "LITHIUM"   No results found for: "PHENYTOIN", "PHENOBARB", "VALPROATE", "CBMZ"   .res Assessment: Plan:     Greater than 50% of 30 min video visit time with patient was spent on counseling and coordination of care. Pt will be returning to original dose of 20 mg due to increase in depression and anxiety. He will be working in [place] and only returns twice per year. He is unable to obtain his medication there.  Trintellix, #90 20 mg tablet daily in the am Still living in [place] and does not have ready access to medication. Needs 90 day supply.   Will report worsening symptoms promptly To follow up in 6-12 months or sooner if  possible Provided emergency contact information Reviewed PDMP  Arlys John A. Neomia Herbel, NP  Diagnoses and all orders for this visit:  Major depressive disorder, recurrent episode, moderate (HCC) -     vortioxetine HBr (TRINTELLIX) 20 MG TABS tablet; Take 1 tablet (20 mg total) by mouth daily.  Generalized anxiety disorder -     vortioxetine HBr (TRINTELLIX) 20 MG TABS tablet; Take 1 tablet (20 mg total) by mouth daily.     Please see After  Visit Summary for patient specific instructions.  No future appointments.  No orders of the defined types were placed in this encounter.     -------------------------------

## 2024-06-08 ENCOUNTER — Ambulatory Visit: Admitting: Behavioral Health
